# Patient Record
Sex: Male | Born: 1995 | Race: Black or African American | Hispanic: No | Marital: Single | Smoking: Never smoker
Health system: Southern US, Community
[De-identification: ages and names within clinical notes are randomized; demographics above are authoritative.]

## PROBLEM LIST (undated history)

## (undated) DIAGNOSIS — J353 Hypertrophy of tonsils with hypertrophy of adenoids: Secondary | ICD-10-CM

## (undated) DIAGNOSIS — R0683 Snoring: Secondary | ICD-10-CM

## (undated) DIAGNOSIS — T7840XA Allergy, unspecified, initial encounter: Secondary | ICD-10-CM

## (undated) DIAGNOSIS — G4730 Sleep apnea, unspecified: Secondary | ICD-10-CM

## (undated) HISTORY — PX: TYMPANOSTOMY TUBE PLACEMENT: SHX32

---
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
## 2016-01-19 ENCOUNTER — Emergency Department (HOSPITAL_COMMUNITY)
Admission: EM | Admit: 2016-01-19 | Discharge: 2016-01-19 | Disposition: A | Discharge status: Home / Self Care | Payer: Medicaid Other | Attending: Physician Assistant | Admitting: Physician Assistant

## 2016-01-19 ENCOUNTER — Encounter (HOSPITAL_COMMUNITY): Payer: Self-pay

## 2016-01-19 DIAGNOSIS — Z79899 Other long term (current) drug therapy: Secondary | ICD-10-CM | POA: Insufficient documentation

## 2016-01-19 DIAGNOSIS — H66011 Acute suppurative otitis media with spontaneous rupture of ear drum, right ear: Secondary | ICD-10-CM | POA: Diagnosis not present

## 2016-01-19 DIAGNOSIS — Z8709 Personal history of other diseases of the respiratory system: Secondary | ICD-10-CM | POA: Diagnosis not present

## 2016-01-19 DIAGNOSIS — H9221 Otorrhagia, right ear: Secondary | ICD-10-CM | POA: Diagnosis present

## 2016-01-19 MED ORDER — AMOXICILLIN 500 MG PO CAPS: 500.0000 mg | ORAL_CAPSULE | Freq: Three times a day (TID) | ORAL | Status: AC

## 2016-01-19 MED ORDER — CIPROFLOXACIN-DEXAMETHASONE 0.3-0.1 % OT SUSP: OTIC | Status: AC

## 2016-01-19 NOTE — ED Provider Notes (Signed)
CSN: [id number]     Arrival date & time 01/19/16  1024 History   First MD Initiated Contact with Patient 01/19/16 1154     Chief Complaint  Patient presents with  . ear bleeding      (Consider location/radiation/quality/duration/timing/severity/associated sxs/prior Treatment) HPI   Jermaine Chan Is a 20 year old male who presents emergency Department with chief complaint of bleeding from the right ear. Patient states that yesterday he had popping bubbling and pressure in the right ear all day. Patient states that when he awoke this morning he was bleeding from the right ear. He denies pain, headache, symptoms of URI, swollen glands or trauma to the ear.  Past Medical History  Diagnosis Date  . Snores   . Allergy   . Observed sleep apnea     not diagnosed with sleep study  . Adenotonsillar hypertrophy    Past Surgical History  Procedure Laterality Date  . Tympanostomy tube placement      at [facility] 05-22-13 by dr Suszanne Conners  . Tonsillectomy and adenoidectomy Bilateral 06/11/2013    Procedure: TONSILLECTOMY AND ADENOIDECTOMY;  Surgeon: Darletta Moll, MD;  Location: [facility];  Service: ENT;  Laterality: Bilateral;   Family History  Problem Relation Age of Onset  . Cancer Mother    Social History  Substance Use Topics  . Smoking status: Never Smoker   . Smokeless tobacco: Never Used  . Alcohol Use: No    Review of Systems  Ten systems reviewed and are negative for acute change, except as noted in the HPI.    Allergies  Shellfish allergy  Home Medications   Prior to Admission medications   Medication Sig Start Date End Date Taking? Authorizing Provider  amoxicillin (AMOXIL) 500 MG capsule Take 1 capsule (500 mg total) by mouth 3 (three) times daily. 01/19/16   Arthor Captain, PA-C  cetirizine (ZYRTEC ALLERGY) 10 MG tablet Take 1 tablet (10 mg total) by mouth daily. 04/15/13   Emilia Beck, PA-C  ciprofloxacin-dexamethasone (CIPRODEX) otic suspension 4 drops 2  (two) times daily for 7 days. 01/19/16   Arthor Captain, PA-C  HYDROcodone-acetaminophen (HYCET) 7.5-325 mg/15 ml solution Take 15 mLs by mouth every 4 (four) hours as needed for pain. 06/11/13   Newman Pies, MD   BP 135/91 mmHg  Pulse 54  Temp(Src) 98.2 F (36.8 C) (Oral)  Resp 17  Ht  (1.702 m)  Wt 97.523 kg  BMI 33.67 kg/m2  SpO2 100% Physical Exam  Constitutional: He is oriented to person, place, and time. He appears well-developed and well-nourished. No distress.  HENT:  Head: Normocephalic and atraumatic.  Left external ear, ear canal and TM normal. Right external ear are normal without mastoid tenderness. Canal is inflamed with debris. It is a perforation of the right TM with purulent material and bleeding.  Eyes: Conjunctivae are normal. No scleral icterus.  Neck: Normal range of motion. Neck supple.  Cardiovascular: Normal rate, regular rhythm and normal heart sounds.   Pulmonary/Chest: Effort normal and breath sounds normal. No respiratory distress.  Abdominal: Soft. There is no tenderness.  Musculoskeletal: He exhibits no edema.  Neurological: He is alert and oriented to person, place, and time.  Skin: Skin is warm and dry. He is not diaphoretic.  Psychiatric: His behavior is normal.  Nursing note and vitals reviewed.   ED Course  Procedures (including critical care time) Labs Review Labs Reviewed - No data to display  Imaging Review No results found. I have personally reviewed and  evaluated these images and lab results as part of my medical decision-making.   EKG Interpretation None      MDM   Final diagnoses:  Acute suppurative otitis media of right ear with spontaneous rupture of tympanic membrane, recurrence not specified     Patient with suppurative OM with spontaneous rupture of the right TM and bleeding. No signs of mastoiditis or meningitis. Patient will be discharged with Ciprodex eardrops and amoxicillin. Discussed return precautions. Follow up  with ENT. Appears safe for discharge at this time.   Arthor Captain, PA-C 01/20/16 1511  Arthor Captain, PA-C 01/20/16 1511  Arthor Captain, PA-C 01/20/16 1512  Courteney Randall An, MD 01/22/16 [phone]

## 2016-01-19 NOTE — ED Notes (Signed)
Patient c/o having "popping" in his right ear yesterday. Today the patient woke with small amount of blood from the right ear. Patient denies any pain.

## 2016-01-19 NOTE — Discharge Instructions (Signed)
Otitis Media, Adult Otitis media is redness, soreness, and inflammation of the middle ear. Otitis media may be caused by allergies or, most commonly, by infection. Often it occurs as a complication of the common cold. SIGNS AND SYMPTOMS Symptoms of otitis media may include:  Earache.  Fever.  Ringing in your ear.  Headache.  Leakage of fluid from the ear. DIAGNOSIS To diagnose otitis media, your health care provider will examine your ear with an otoscope. This is an instrument that allows your health care provider to see into your ear in order to examine your eardrum. Your health care provider also will ask you questions about your symptoms. TREATMENT  Typically, otitis media resolves on its own within 3-5 days. Your health care provider may prescribe medicine to ease your symptoms of pain. If otitis media does not resolve within 5 days or is recurrent, your health care provider may prescribe antibiotic medicines if he or she suspects that a bacterial infection is the cause. HOME CARE INSTRUCTIONS   If you were prescribed an antibiotic medicine, finish it all even if you start to feel better.  Take medicines only as directed by your health care provider.  Keep all follow-up visits as directed by your health care provider. SEEK MEDICAL CARE IF:  You have otitis media only in one ear, or bleeding from your nose, or both.  You notice a lump on your neck.  You are not getting better in 3-5 days.  You feel worse instead of better. SEEK IMMEDIATE MEDICAL CARE IF:   You have pain that is not controlled with medicine.  You have swelling, redness, or pain around your ear or stiffness in your neck.  You notice that part of your face is paralyzed.  You notice that the bone behind your ear (mastoid) is tender when you touch it. MAKE SURE YOU:   Understand these instructions.  Will watch your condition.  Will get help right away if you are not doing well or get worse.   This  information is not intended to replace advice given to you by your health care provider. Make sure you discuss any questions you have with your health care provider.   Document Released: 09/09/2004 Document Revised: 12/26/2014 Document Reviewed: 07/02/2013 Elsevier Interactive Patient Education 2016 Else[employer] Inc. Tympanic Membrane Perforation The eardrum (tympanic membrane) protects the inner ear from the outside environment. In addition to protection, the eardrum allows you to hear by transmitting sound waves to the bones in your ear and then to the nervous system. The tympanic membrane is easily perforated, which may result in damage to the inner ear. SYMPTOMS   Sometimes there are no symptoms.  Decreased hearing.  Fluid drainage from ear.  Ear pain. CAUSES   Most commonly, a middle ear infection from built-up pressure.  Injury from a cotton swab.  Traumatic injury to the side of the head. RISK INCREASES WITH:  Frequent middle ear infections.  Use of cotton swabs. PREVENTION   Do not use cotton swabs to clean the ear canal.  If you have ear pain or pressure, see your caregiver to rule out an ear infection that needs treatment. TREATMENT  Protecting the inner ear and allowing the membrane to heal on its own is how tympanic membrane rupture is usually treated. Healing may take several weeks. In order to protect the inner ear, do not allow any fluid to enter the ear canal. Avoid being submerged in water. The use of ear drops may prevent an ear  infection from developing, but they should be used with caution, as ear drops can also cause damage to the inner ear. It is important to follow up with your caregiver to confirm healing of the tympanic membrane. If the membrane does not heal, permanent hearing loss may occur. To avoid serious complications, tympanic membranes that do not heal on their own are repaired with surgery.   This information is not intended to replace advice given to  you by your health care provider. Make sure you discuss any questions you have with your health care provider.   Document Released: 12/05/2005 Document Revised: 02/27/2012 Document Reviewed: 06/17/2015 Elsevier Interactive Patient Education [employer]

## 2020-06-25 ENCOUNTER — Emergency Department (HOSPITAL_COMMUNITY)
Admission: EM | Admit: 2020-06-25 | Discharge: 2020-06-25 | Disposition: A | Discharge status: Home / Self Care | Payer: No Typology Code available for payment source | Attending: Emergency Medicine | Admitting: Emergency Medicine

## 2020-06-25 ENCOUNTER — Encounter (HOSPITAL_COMMUNITY): Payer: Self-pay | Admitting: Emergency Medicine

## 2020-06-25 ENCOUNTER — Emergency Department (HOSPITAL_COMMUNITY): Payer: No Typology Code available for payment source

## 2020-06-25 DIAGNOSIS — M542 Cervicalgia: Secondary | ICD-10-CM | POA: Diagnosis not present

## 2020-06-25 DIAGNOSIS — R0789 Other chest pain: Secondary | ICD-10-CM | POA: Insufficient documentation

## 2020-06-25 DIAGNOSIS — Z79899 Other long term (current) drug therapy: Secondary | ICD-10-CM | POA: Diagnosis not present

## 2020-06-25 DIAGNOSIS — M79662 Pain in left lower leg: Secondary | ICD-10-CM | POA: Diagnosis not present

## 2020-06-25 DIAGNOSIS — Z91010 Allergy to peanuts: Secondary | ICD-10-CM | POA: Insufficient documentation

## 2020-06-25 DIAGNOSIS — Y92410 Unspecified street and highway as the place of occurrence of the external cause: Secondary | ICD-10-CM | POA: Insufficient documentation

## 2020-06-25 DIAGNOSIS — R52 Pain, unspecified: Secondary | ICD-10-CM

## 2020-06-25 DIAGNOSIS — Y999 Unspecified external cause status: Secondary | ICD-10-CM | POA: Diagnosis not present

## 2020-06-25 DIAGNOSIS — Y93I9 Activity, other involving external motion: Secondary | ICD-10-CM | POA: Insufficient documentation

## 2020-06-25 DIAGNOSIS — R1030 Lower abdominal pain, unspecified: Secondary | ICD-10-CM | POA: Diagnosis not present

## 2020-06-25 MED ORDER — IBUPROFEN 800 MG PO TABS
800.0000 mg | ORAL_TABLET | Freq: Once | ORAL | Status: AC
  Administered 2020-06-25: 800 mg via ORAL
  Filled 2020-06-25: qty 1

## 2020-06-25 MED ORDER — KETOROLAC TROMETHAMINE 30 MG/ML IJ SOLN
15.0000 mg | Freq: Once | INTRAMUSCULAR | Status: DC
  Filled 2020-06-25: qty 1
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
